# Patient Record
Sex: Male | Born: 1995 | Race: White | Hispanic: Yes | Marital: Single | Smoking: Never smoker
Health system: Southern US, Community
[De-identification: ages and names within clinical notes are randomized; demographics above are authoritative.]

## PROBLEM LIST (undated history)

## (undated) DIAGNOSIS — F909 Attention-deficit hyperactivity disorder, unspecified type: Secondary | ICD-10-CM

## (undated) DIAGNOSIS — T7840XA Allergy, unspecified, initial encounter: Secondary | ICD-10-CM

## (undated) HISTORY — DX: Attention-deficit hyperactivity disorder, unspecified type: F90.9

## (undated) HISTORY — DX: Allergy, unspecified, initial encounter: T78.40XA

---
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
## 2016-07-26 DIAGNOSIS — F338 Other recurrent depressive disorders: Secondary | ICD-10-CM | POA: Diagnosis not present

## 2016-07-26 DIAGNOSIS — F902 Attention-deficit hyperactivity disorder, combined type: Secondary | ICD-10-CM | POA: Diagnosis not present

## 2016-07-26 DIAGNOSIS — F4010 Social phobia, unspecified: Secondary | ICD-10-CM | POA: Diagnosis not present

## 2016-07-26 DIAGNOSIS — F429 Obsessive-compulsive disorder, unspecified: Secondary | ICD-10-CM | POA: Diagnosis not present

## 2016-07-26 DIAGNOSIS — Z79899 Other long term (current) drug therapy: Secondary | ICD-10-CM | POA: Diagnosis not present

## 2016-07-26 DIAGNOSIS — R41840 Attention and concentration deficit: Secondary | ICD-10-CM | POA: Diagnosis not present

## 2016-07-26 DIAGNOSIS — F419 Anxiety disorder, unspecified: Secondary | ICD-10-CM | POA: Diagnosis not present

## 2016-08-09 MED FILL — ADDERALL XR 20 MG CAP SA: 20 | 30 days supply | Qty: 30 | Fill #0

## 2016-08-26 DIAGNOSIS — Z79899 Other long term (current) drug therapy: Secondary | ICD-10-CM | POA: Diagnosis not present

## 2016-08-26 DIAGNOSIS — R480 Dyslexia and alexia: Secondary | ICD-10-CM | POA: Diagnosis not present

## 2016-09-25 MED FILL — ADDERALL XR 20 MG CAP SA: 20 | 30 days supply | Qty: 30 | Fill #0

## 2016-11-22 DIAGNOSIS — Z79899 Other long term (current) drug therapy: Secondary | ICD-10-CM | POA: Diagnosis not present

## 2016-11-22 DIAGNOSIS — R480 Dyslexia and alexia: Secondary | ICD-10-CM | POA: Diagnosis not present

## 2016-11-22 DIAGNOSIS — F902 Attention-deficit hyperactivity disorder, combined type: Secondary | ICD-10-CM | POA: Diagnosis not present

## 2017-01-09 MED FILL — ADDERALL XR 20 MG CAP SA: 20 | 30 days supply | Qty: 30 | Fill #0

## 2017-01-16 DIAGNOSIS — J302 Other seasonal allergic rhinitis: Secondary | ICD-10-CM | POA: Diagnosis not present

## 2017-02-21 DIAGNOSIS — R480 Dyslexia and alexia: Secondary | ICD-10-CM | POA: Diagnosis not present

## 2017-02-21 DIAGNOSIS — Z79899 Other long term (current) drug therapy: Secondary | ICD-10-CM | POA: Diagnosis not present

## 2017-02-21 DIAGNOSIS — F902 Attention-deficit hyperactivity disorder, combined type: Secondary | ICD-10-CM | POA: Diagnosis not present

## 2017-02-21 MED FILL — ADDERALL XR 20 MG CAP SA: 20 | 30 days supply | Qty: 30 | Fill #0

## 2017-04-16 IMAGING — DX DG HAND COMPLETE 3+V*L*
3 series · 3 of 3 positions shown · non-contrast
Comparison: None.

CLINICAL DATA: Football injury today, pain and swelling fifth
digit.

EXAM:
LEFT HAND - COMPLETE 3+ VIEW

[hand pa]
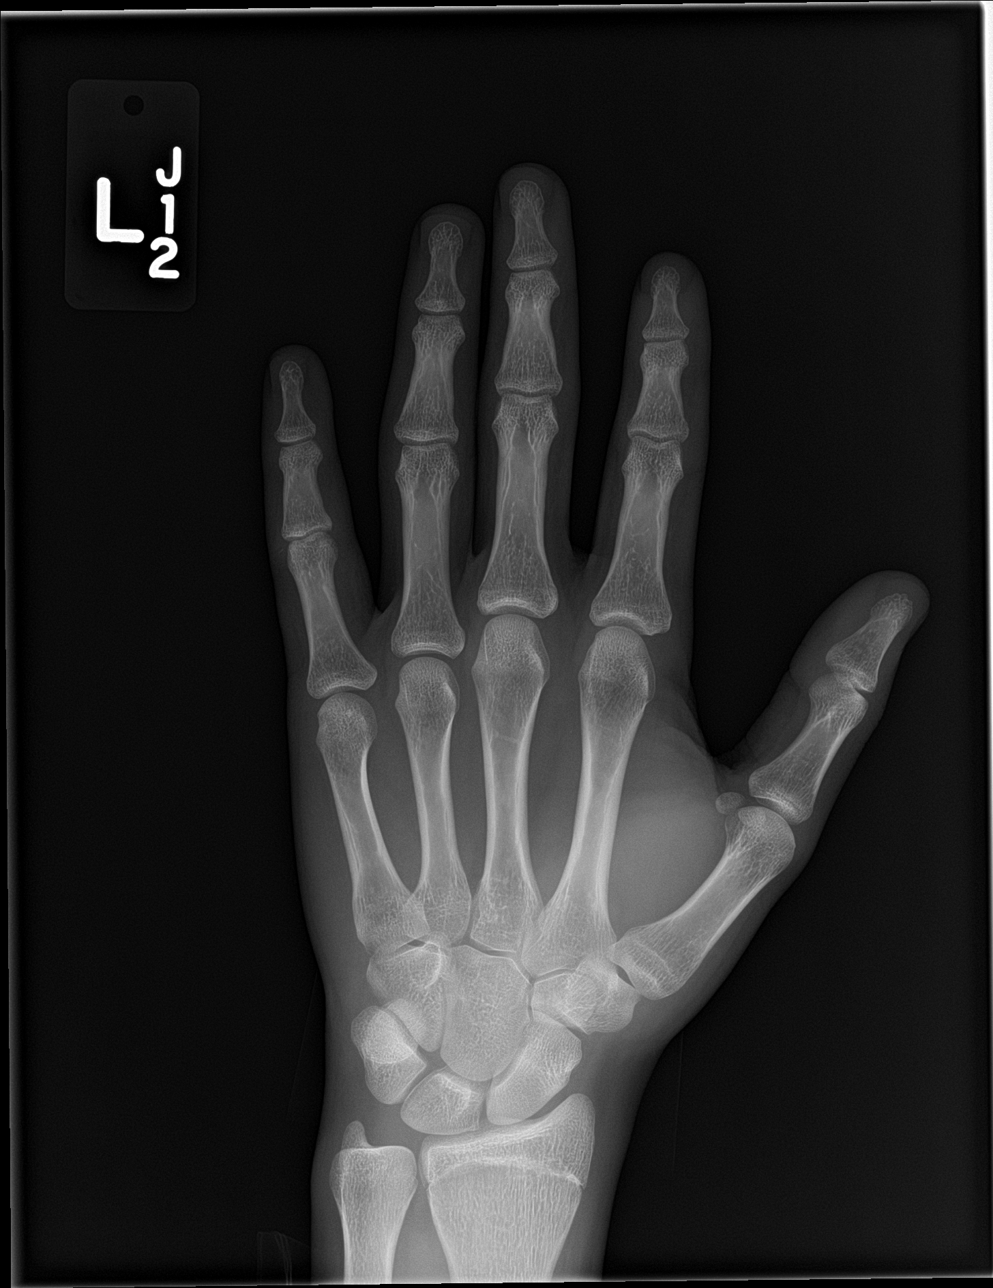

[hand obl]
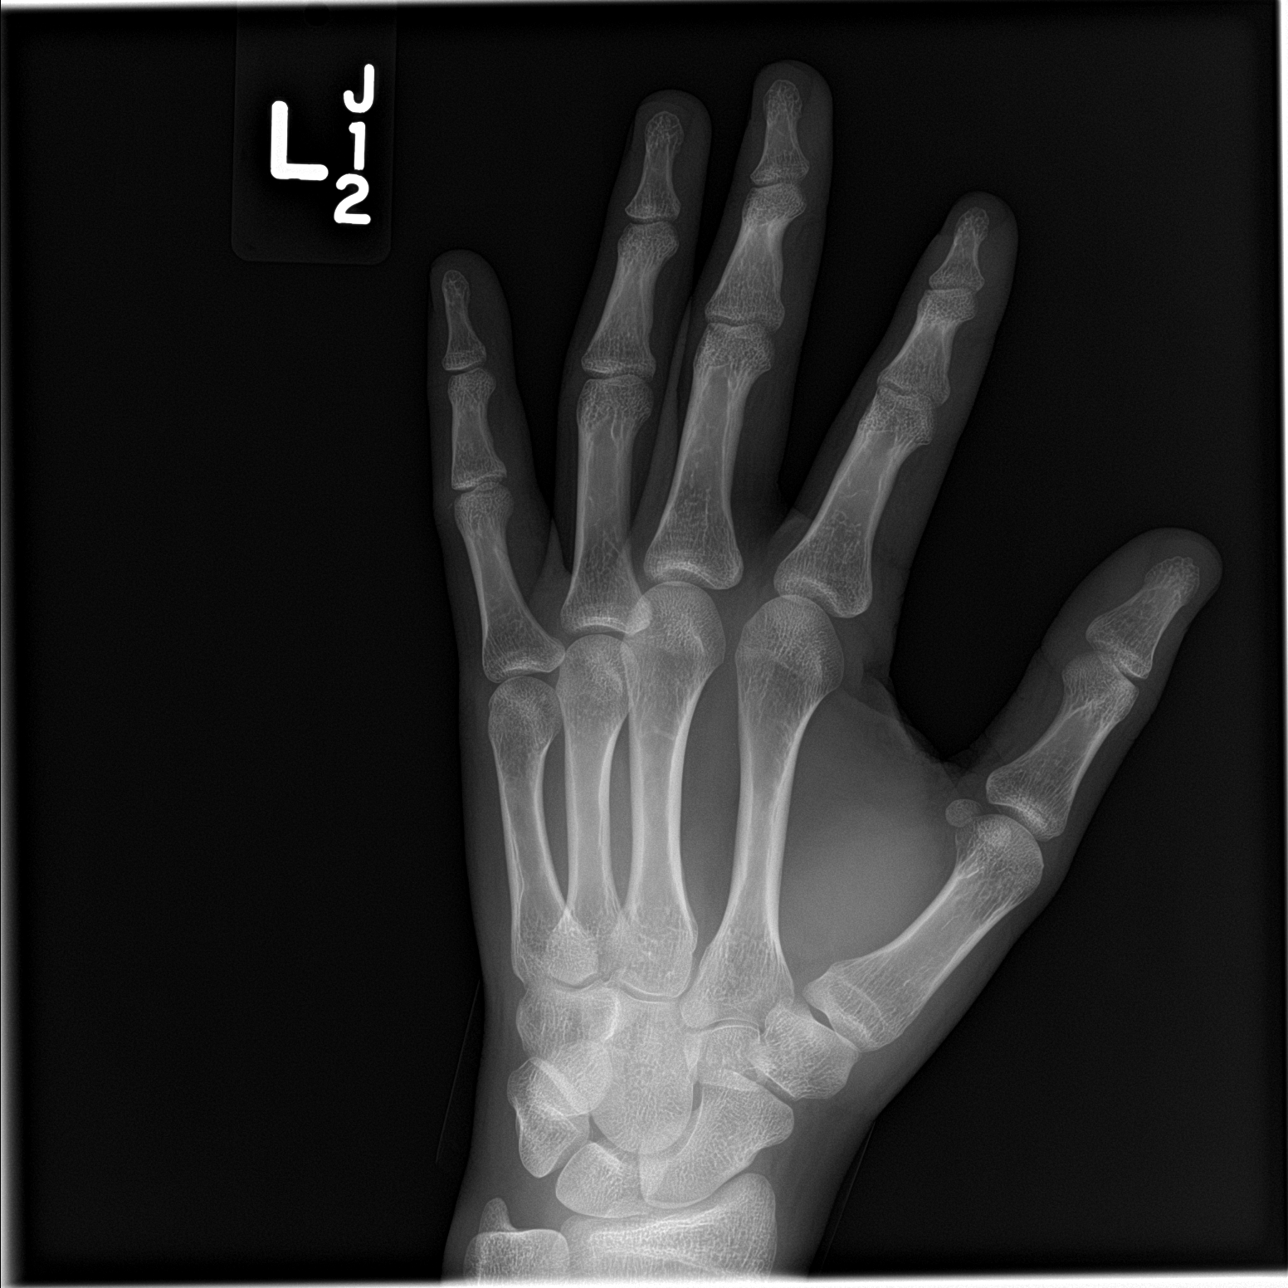

[hand lat]
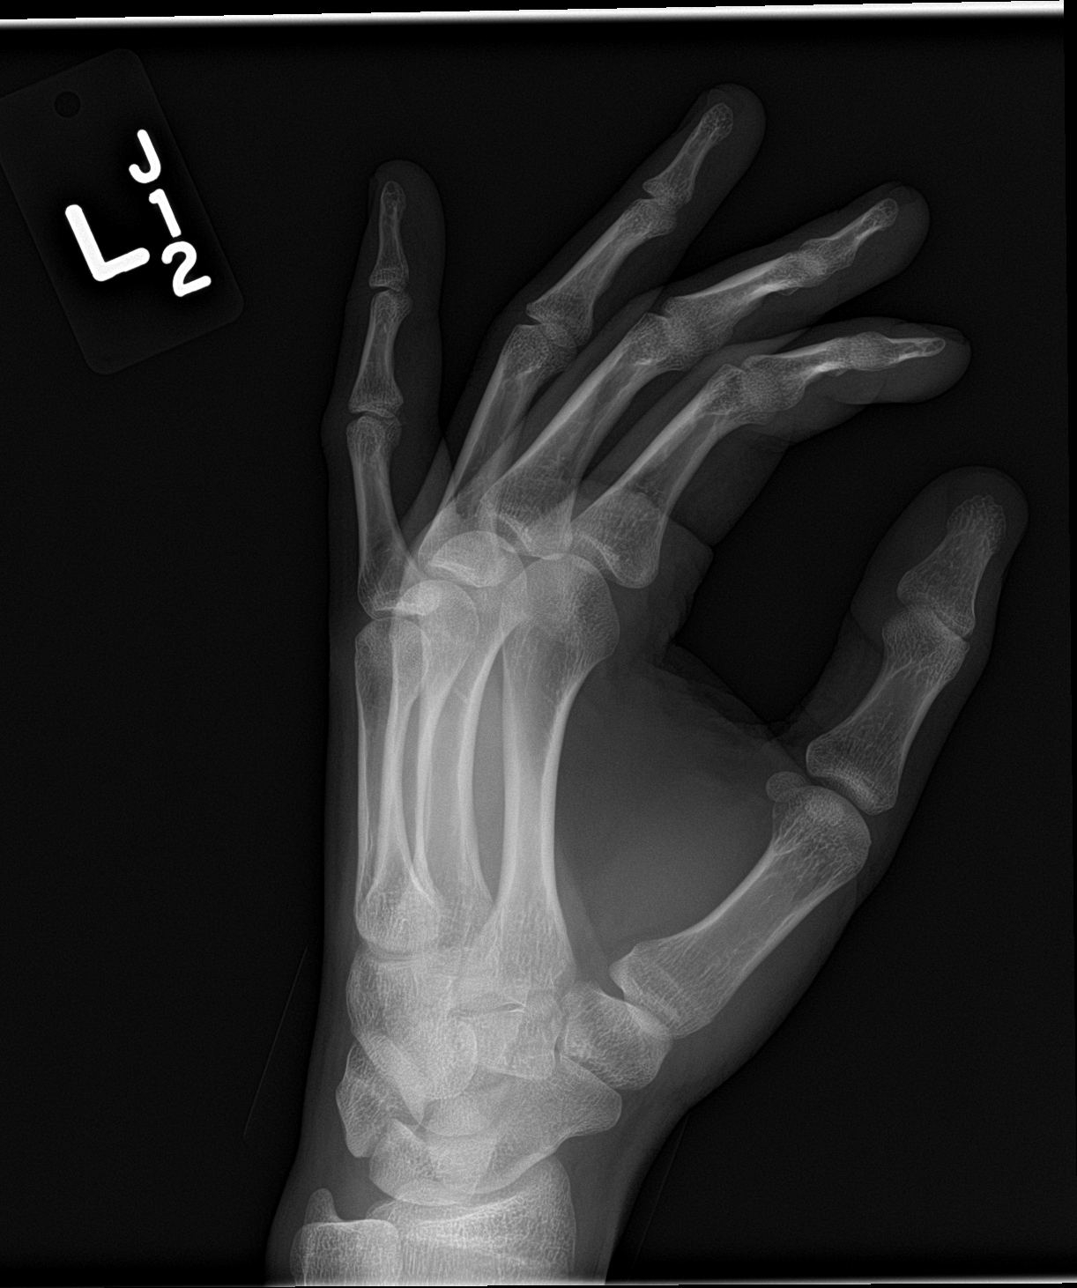

[3 of 3 positions shown; findings below may reference images not displayed]

FINDINGS: There is a tiny avulsion fracture fragment overlying the distal
margin of the left fifth proximal phalanx. Donor site for this tiny
avulsion fragment is noted at the distal margin of the proximal
phalanx.

Alignment at the adjacent fifth interphalangeal joint space is
normal. Associated soft tissue swelling noted.

Remainder of the left hand plain film examination is unremarkable
IMPRESSION: Tiny avulsion fracture fragment overlying the distal margin of the
left fifth proximal phalanx, of uncertain age but probably acute,
originating from the distal cortical margin of the proximal phalanx.

Alignment at the proximal interphalangeal joint space is normal.

Associated soft tissue swelling.

## 2017-05-29 DIAGNOSIS — R480 Dyslexia and alexia: Secondary | ICD-10-CM | POA: Diagnosis not present

## 2017-05-29 DIAGNOSIS — F902 Attention-deficit hyperactivity disorder, combined type: Secondary | ICD-10-CM | POA: Diagnosis not present

## 2017-05-29 DIAGNOSIS — Z79899 Other long term (current) drug therapy: Secondary | ICD-10-CM | POA: Diagnosis not present

## 2017-05-29 MED FILL — ADDERALL XR 20 MG CAP SA: 20 | 30 days supply | Qty: 30 | Fill #0

## 2017-07-28 MED FILL — ADDERALL XR 20 MG CAP SA: 20 | 30 days supply | Qty: 30 | Fill #0

## 2017-08-01 DIAGNOSIS — Z79899 Other long term (current) drug therapy: Secondary | ICD-10-CM | POA: Diagnosis not present

## 2017-08-01 DIAGNOSIS — F902 Attention-deficit hyperactivity disorder, combined type: Secondary | ICD-10-CM | POA: Diagnosis not present

## 2017-09-02 MED FILL — ADDERALL XR 20 MG CAP SA: 20 | 30 days supply | Qty: 30 | Fill #0

## 2017-10-16 MED FILL — ADDERALL XR 20 MG CAP SA: 20 | 30 days supply | Qty: 30 | Fill #0

## 2017-12-16 DIAGNOSIS — F902 Attention-deficit hyperactivity disorder, combined type: Secondary | ICD-10-CM | POA: Diagnosis not present

## 2017-12-16 DIAGNOSIS — Z79899 Other long term (current) drug therapy: Secondary | ICD-10-CM | POA: Diagnosis not present

## 2017-12-16 MED FILL — ADDERALL XR 25 MG CAPSULE: 25 | 30 days supply | Qty: 30 | Fill #0

## 2018-04-29 DIAGNOSIS — Z79899 Other long term (current) drug therapy: Secondary | ICD-10-CM | POA: Diagnosis not present

## 2018-04-29 DIAGNOSIS — F902 Attention-deficit hyperactivity disorder, combined type: Secondary | ICD-10-CM | POA: Diagnosis not present

## 2018-09-10 DIAGNOSIS — R480 Dyslexia and alexia: Secondary | ICD-10-CM | POA: Diagnosis not present

## 2018-09-10 DIAGNOSIS — F902 Attention-deficit hyperactivity disorder, combined type: Secondary | ICD-10-CM | POA: Diagnosis not present

## 2018-09-10 DIAGNOSIS — Z79899 Other long term (current) drug therapy: Secondary | ICD-10-CM | POA: Diagnosis not present

## 2018-12-08 DIAGNOSIS — F902 Attention-deficit hyperactivity disorder, combined type: Secondary | ICD-10-CM | POA: Diagnosis not present

## 2019-03-24 DIAGNOSIS — F902 Attention-deficit hyperactivity disorder, combined type: Secondary | ICD-10-CM | POA: Diagnosis not present

## 2019-03-24 DIAGNOSIS — Z79899 Other long term (current) drug therapy: Secondary | ICD-10-CM | POA: Diagnosis not present

## 2019-09-02 DIAGNOSIS — F902 Attention-deficit hyperactivity disorder, combined type: Secondary | ICD-10-CM | POA: Diagnosis not present

## 2019-09-02 DIAGNOSIS — Z79899 Other long term (current) drug therapy: Secondary | ICD-10-CM | POA: Diagnosis not present

## 2019-10-01 ENCOUNTER — Ambulatory Visit: Payer: 59 | Attending: Internal Medicine

## 2019-10-01 DIAGNOSIS — Z23 Encounter for immunization: Secondary | ICD-10-CM

## 2019-10-01 NOTE — Progress Notes (Signed)
   Covid-19 Vaccination Clinic  Name:  Jason Gibson    MRN: [id number] DOB: 1996-05-20  10/01/2019  Mr. Charland was observed post Covid-19 immunization for 15 minutes without incident. He was provided with Vaccine Information Sheet and instruction to access the V-Safe system.   Mr. Cowger was instructed to call 911 with any severe reactions post vaccine: Marland Kitchen Difficulty breathing  . Swelling of face and throat  . A fast heartbeat  . A bad rash all over body  . Dizziness and weakness   Immunizations Administered    Name Date Dose VIS Date Route   Pfizer COVID-19 Vaccine 10/01/2019  2:12 PM 0.3 mL 07/28/2018 Intramuscular   Manufacturer: [employer], [employer]   Lot: [id number]   NDC: [address]-2

## 2019-10-25 ENCOUNTER — Ambulatory Visit: Payer: 59 | Attending: Internal Medicine

## 2019-10-25 DIAGNOSIS — Z23 Encounter for immunization: Secondary | ICD-10-CM

## 2019-10-25 NOTE — Progress Notes (Signed)
   Covid-19 Vaccination Clinic  Name:  Jason Gibson    MRN: [id number] DOB: 11/13/1995  10/25/2019  [name] was observed post Covid-19 immunization for 15 minutes without incident. He was provided with Vaccine Information Sheet and instruction to access the V-Safe system.   [name] was instructed to call 911 with any severe reactions post vaccine: Marland Kitchen Difficulty breathing  . Swelling of face and throat  . A fast heartbeat  . A bad rash all over body  . Dizziness and weakness   Immunizations Administered    Name Date Dose VIS Date Route   Pfizer COVID-19 Vaccine 10/25/2019  3:10 PM 0.3 mL 07/28/2018 Intramuscular   Manufacturer: [employer], [employer]   Lot: [id number]   NDC: [address]-2

## 2020-05-24 DIAGNOSIS — Z1159 Encounter for screening for other viral diseases: Secondary | ICD-10-CM | POA: Diagnosis not present

## 2020-05-24 DIAGNOSIS — Z20828 Contact with and (suspected) exposure to other viral communicable diseases: Secondary | ICD-10-CM | POA: Diagnosis not present

## 2020-05-25 ENCOUNTER — Ambulatory Visit: Payer: 59 | Admitting: Family Medicine

## 2020-05-25 DIAGNOSIS — Z03818 Encounter for observation for suspected exposure to other biological agents ruled out: Secondary | ICD-10-CM | POA: Diagnosis not present

## 2020-05-25 DIAGNOSIS — Z20822 Contact with and (suspected) exposure to covid-19: Secondary | ICD-10-CM | POA: Diagnosis not present

## 2021-06-03 HISTORY — PX: WISDOM TOOTH EXTRACTION: SHX21

## 2021-06-08 ENCOUNTER — Other Ambulatory Visit: Payer: Self-pay

## 2021-06-08 MED ORDER — HYDROCODONE-ACETAMINOPHEN 10-325 MG PO TABS
ORAL_TABLET | ORAL | 0 refills | Status: DC
  Filled 2021-06-08: qty 15, 4d supply, fill #0

## 2021-06-08 MED ORDER — AMOXICILLIN 500 MG PO CAPS
ORAL_CAPSULE | ORAL | 0 refills | Status: DC
  Filled 2021-06-08: qty 15, 5d supply, fill #0

## 2022-07-09 NOTE — Progress Notes (Unsigned)
    New patient visit   Patient: Jason Gibson   DOB: 11/14/1995   26 y.o. Male  MRN: [id number] Visit Date: 07/10/2022  Today's healthcare provider: [name], MD   No chief complaint on file.  Subjective    Jason Gibson is a 27 y.o. male who presents today as a new patient to establish care.  HPI  ***  No past medical history on file. No past surgical history on file. No family status information on file.   No family history on file. Social History   Socioeconomic History   Marital status: Single    Spouse name: Not on file   Number of children: Not on file   Years of education: Not on file   Highest education level: Not on file  Occupational History   Not on file  Tobacco Use   Smoking status: Never   Smokeless tobacco: Not on file  Substance and Sexual Activity   Alcohol use: No    Alcohol/week: 0.0 standard drinks of alcohol   Drug use: Not on file   Sexual activity: Not on file  Other Topics Concern   Not on file  Social History Narrative   Not on file   Social Determinants of Health   Financial Resource Strain: Not on file  Food Insecurity: Not on file  Transportation Needs: Not on file  Physical Activity: Not on file  Stress: Not on file  Social Connections: Not on file   Outpatient Medications Prior to Visit  Medication Sig   amoxicillin (AMOXIL) 500 MG capsule 1 po TID until gone.   HYDROcodone-acetaminophen (NORCO) 10-325 MG tablet 1 po q 6 h prn pain   loperamide (IMODIUM A-D) 2 MG tablet Take 1 tablet (2 mg total) by mouth 4 (four) times daily as needed for diarrhea or loose stools.   No facility-administered medications prior to visit.   No Known Allergies  Immunization History  Administered Date(s) Administered   PFIZER(Purple Top)SARS-COV-2 Vaccination 10/01/2019, 10/25/2019    Health Maintenance  Topic Date Due   HPV VACCINES (1 - Male 2-dose series) Never done   HIV Screening  Never done   Hepatitis C  Screening  Never done   DTaP/Tdap/Td (1 - Tdap) Never done   COVID-19 Vaccine (3 - Pfizer risk series) 11/22/2019   INFLUENZA VACCINE  Never done    No care team member to display  Review of Systems  {Labs  Heme  Chem  Endocrine  Serology  Results Review (optional):23779}   Objective    There were no vitals taken for this visit. {Show previous vital signs (optional):23777}  Physical Exam ***  Depression Screen     No data to display         No results found for any visits on 07/10/22.  Assessment & Plan      Problem List Items Addressed This Visit   None    No follow-ups on file.       The entirety of the information documented in the History of Present Illness, Review of Systems and Physical Exam were personally obtained by me. Portions of this information were initially documented by *** and reviewed by me for thoroughness and accuracy.[name], MD     [name], MD  [facility] [phone] (phone) [phone] (fax)  [place]

## 2022-07-10 ENCOUNTER — Ambulatory Visit (INDEPENDENT_AMBULATORY_CARE_PROVIDER_SITE_OTHER): Payer: BC Managed Care – PPO | Admitting: Family Medicine

## 2022-07-10 ENCOUNTER — Encounter: Payer: Self-pay | Admitting: Family Medicine

## 2022-07-10 ENCOUNTER — Other Ambulatory Visit: Payer: Self-pay

## 2022-07-10 VITALS — BP 136/87 | HR 94 | Temp 98.0°F | Resp 16 | Ht 67.0 in | Wt 187.9 lb

## 2022-07-10 DIAGNOSIS — Z7689 Persons encountering health services in other specified circumstances: Secondary | ICD-10-CM

## 2022-07-10 DIAGNOSIS — H01132 Eczematous dermatitis of right lower eyelid: Secondary | ICD-10-CM | POA: Diagnosis not present

## 2022-07-10 DIAGNOSIS — F909 Attention-deficit hyperactivity disorder, unspecified type: Secondary | ICD-10-CM

## 2022-07-10 DIAGNOSIS — H01135 Eczematous dermatitis of left lower eyelid: Secondary | ICD-10-CM

## 2022-07-10 MED ORDER — HYDROCORTISONE 2.5 % EX OINT
1.0000 | TOPICAL_OINTMENT | Freq: Two times a day (BID) | CUTANEOUS | 1 refills | Status: AC
  Filled 2022-07-10: qty 28.35, 14d supply, fill #0

## 2022-07-10 NOTE — Assessment & Plan Note (Signed)
Welcome patient to practice Reviewed patient's medical history Reviewed patient's medications Reviewed patient surgical and social history Discussed roles and expectations primary care physician-patient relationship     

## 2022-07-10 NOTE — Patient Instructions (Signed)
I have prescribed a topical for you to apply to your lower eye lids twice daily for the next 2 weeks.  Please follow up with me in three weeks to check the status of your rash.   Please plan to follow up in 6 months for your annual physical.   Eczema Eczema refers to a group of skin conditions that cause skin to become rough and inflamed. Each type of eczema has different triggers, symptoms, and treatments. Eczema of any type is usually itchy. Symptoms range from mild to severe.  Eczema is not spread from person to person (is not contagious). It can appear on different parts of the body at different times. One person's eczema may look different from another person's eczema. What are the causes?  The exact cause of this condition is not known. However, exposure to certain environmental factors, irritants, and allergens can make the condition worse. What are the signs or symptoms? Symptoms of this condition depend on the type of eczema you have. The types include: Contact dermatitis. There are two kinds: Irritant contact dermatitis. This happens when something irritates the skin and causes a rash. Allergic contact dermatitis. This happens when your skin comes in contact with something you are allergic to (allergens). This can include poison ivy, chemicals, or medicines that were applied to your skin. Atopic dermatitis. This is a long-term (chronic) skin disease that keeps coming back (recurring). It is the most common type of eczema. Usual symptoms are a red rash and itchy, dry, scaly skin. It usually starts showing signs in infancy and can last through adulthood. Dyshidrotic eczema. This is a form of eczema on the hands and feet. It shows up as very itchy, fluid-filled blisters. It can affect people of any age but is more common before age 75. Hand eczema. This causes very itchy areas of skin on the palms and sides of the hands and fingers. This type of eczema is common in industrial jobs where  you may be exposed to different types of irritants.  Nummular eczema. This is a common type of eczema that most often affects the lower legs and the backs of the hands. It typically causes an itchy, red, circular, crusty lesion (plaque). Scratching may become a habit and can cause bleeding. Nummular eczema occurs most often in middle-aged or older people. Seborrheic dermatitis. This is a common skin disease that mainly affects the scalp. It may also affect other oily areas of the body, such as the face, sides of the nose, eyebrows, ears, eyelids, and chest. It is marked by small scaling and redness of the skin (erythema). This can affect people of all ages. In infants, this condition is called cradle cap.     Take or apply over-the-counter and prescription medicines only as told by your health care provider. Use creams or ointments to moisturize your skin. Do not use lotions. Learn what triggers or irritates your symptoms so you can avoid these things. Treat symptom flare-ups quickly. Do not scratch your skin. This can make your rash worse. Keep all follow-up visits. This is important.  Where to find more information American Academy of Dermatology: MemberVerification.ca National Eczema Association: nationaleczema.org The Society for Pediatric Dermatology: pedsderm.net Co ntact a health care provider if: You have severe itching, even with treatment. You scratch your skin regularly until it bleeds. Your rash looks different than usual. Your skin is painful, swollen, or more red than usual. You have a fever.

## 2022-07-10 NOTE — Assessment & Plan Note (Signed)
Chronic  Intermittently  Currently flared, erythematous rash  Will prescribe hydrocortisone 2.5% to apply daily for 2 weeks  Will follow up in 3 weeks

## 2022-07-24 ENCOUNTER — Ambulatory Visit: Payer: BC Managed Care – PPO | Admitting: Family Medicine

## 2022-07-30 NOTE — Progress Notes (Unsigned)
     I,Sha'taria Tyson,acting as a Education administrator for [employer], MD.,have documented all relevant documentation on the behalf of [name], MD,as directed by  [name], MD while in the presence of [name], MD.   Established patient visit   Patient: Jason Gibson   DOB: January 25, 1996   27 y.o. Male  MRN: [id number] Visit Date: 07/31/2022  Today's healthcare provider: [name], MD   No chief complaint on file.  Subjective    HPI  Follow up for rash  The patient was last seen for this 2 weeks ago. Changes made at last visit include hydrocortisone 2.5% to apply daily for 2 weeks .  He reports excellent compliance with treatment. He feels that condition is  resolved and then returned about 2 days after no longer using steroid topical . He is not having side effects.   -----------------------------------------------------------------------------------------   Medications: No outpatient medications prior to visit.   No facility-administered medications prior to visit.    Review of Systems     Objective    BP 134/82 (BP Location: Right Arm, Patient Position: Sitting, Cuff Size: Normal)   Pulse 93   Wt 184 lb 4.8 oz (83.6 kg)   SpO2 100%   BMI 28.87 kg/m    Physical Exam Eyes:     General: Lids are normal.     Extraocular Movements: Extraocular movements intact.      Comments: Bilateral inferior eye lid macular rash with fine scale, worse on left eye compared to right eye   Pulmonary:     Effort: Pulmonary effort is normal. No respiratory distress.  Skin:    Findings: Rash present.      No results found for any visits on 07/31/22.  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        The entirety of the information documented in the History of Present Illness, Review of Systems and Physical Exam were personally obtained by me. Portions of this information were initially documented by Beverlee Nims . I, [name], MD have reviewed the documentation above for thoroughness and accuracy.      [name], MD  [facility] [phone] (phone) [phone] (fax)  [place]

## 2022-07-31 ENCOUNTER — Other Ambulatory Visit: Payer: Self-pay

## 2022-07-31 ENCOUNTER — Encounter: Payer: Self-pay | Admitting: Family Medicine

## 2022-07-31 ENCOUNTER — Ambulatory Visit (INDEPENDENT_AMBULATORY_CARE_PROVIDER_SITE_OTHER): Payer: BC Managed Care – PPO | Admitting: Family Medicine

## 2022-07-31 VITALS — BP 134/82 | HR 93 | Wt 184.3 lb

## 2022-07-31 DIAGNOSIS — H01135 Eczematous dermatitis of left lower eyelid: Secondary | ICD-10-CM

## 2022-07-31 DIAGNOSIS — H01132 Eczematous dermatitis of right lower eyelid: Secondary | ICD-10-CM | POA: Diagnosis not present

## 2022-07-31 DIAGNOSIS — Z23 Encounter for immunization: Secondary | ICD-10-CM | POA: Diagnosis not present

## 2022-07-31 MED ORDER — TRIAMCINOLONE ACETONIDE 0.025 % EX OINT
1.0000 | TOPICAL_OINTMENT | Freq: Two times a day (BID) | CUTANEOUS | 2 refills | Status: AC
  Filled 2022-07-31: qty 30, 30d supply, fill #0

## 2022-07-31 NOTE — Addendum Note (Signed)
Addended by: Barnie Mort on: 07/31/2022 03:54 PM   Modules accepted: Orders

## 2022-07-31 NOTE — Assessment & Plan Note (Addendum)
Continuous to have erythematous macules on lower eye lids bilaterally despite two weeks treatment with topical steroids  Will trial increased potency topical therapy today  prescription for triamcinolone 0.025 twice daily for 14 days sent to pharmacy in the meantime  Referral submitted for dermatologic evaluation

## 2022-08-05 ENCOUNTER — Other Ambulatory Visit: Payer: Self-pay

## 2022-08-05 DIAGNOSIS — L2089 Other atopic dermatitis: Secondary | ICD-10-CM | POA: Diagnosis not present

## 2022-08-05 MED ORDER — OPZELURA 1.5 % EX CREA
1.0000 | TOPICAL_CREAM | Freq: Every day | CUTANEOUS | 1 refills | Status: AC
  Filled 2022-08-05 – 2022-08-06 (×2): qty 60, 30d supply, fill #0
  Filled 2022-08-09: qty 60, 90d supply, fill #0
  Filled 2022-08-21 – 2022-08-22 (×3): qty 60, 30d supply, fill #0

## 2022-08-06 ENCOUNTER — Other Ambulatory Visit: Payer: Self-pay

## 2022-08-09 ENCOUNTER — Other Ambulatory Visit: Payer: Self-pay

## 2022-08-13 ENCOUNTER — Other Ambulatory Visit: Payer: Self-pay

## 2022-08-21 ENCOUNTER — Other Ambulatory Visit: Payer: Self-pay

## 2022-08-22 ENCOUNTER — Other Ambulatory Visit: Payer: Self-pay

## 2023-01-10 ENCOUNTER — Encounter: Payer: BC Managed Care – PPO | Admitting: Family Medicine
